# Patient Record
Sex: Female | Born: 1965 | Hispanic: Yes
Health system: Southern US, Community
[De-identification: ages and names within clinical notes are randomized; demographics above are authoritative.]

## PROBLEM LIST (undated history)

## (undated) DIAGNOSIS — I10 Essential (primary) hypertension: Secondary | ICD-10-CM

## (undated) DIAGNOSIS — R079 Chest pain, unspecified: Secondary | ICD-10-CM

---

## 2014-05-27 ENCOUNTER — Encounter (HOSPITAL_COMMUNITY): Payer: Self-pay | Admitting: Emergency Medicine

## 2014-05-27 ENCOUNTER — Emergency Department (HOSPITAL_COMMUNITY)
Admission: EM | Admit: 2014-05-27 | Discharge: 2014-05-27 | Disposition: A | Discharge status: Home / Self Care | Payer: Self-pay | Attending: Emergency Medicine | Admitting: Emergency Medicine

## 2014-05-27 ENCOUNTER — Emergency Department (HOSPITAL_COMMUNITY): Payer: Self-pay

## 2014-05-27 DIAGNOSIS — R0781 Pleurodynia: Secondary | ICD-10-CM

## 2014-05-27 DIAGNOSIS — R071 Chest pain on breathing: Secondary | ICD-10-CM | POA: Insufficient documentation

## 2014-05-27 DIAGNOSIS — I10 Essential (primary) hypertension: Secondary | ICD-10-CM | POA: Insufficient documentation

## 2014-05-27 LAB — COMPREHENSIVE METABOLIC PANEL
ALT: 137 U/L — ABNORMAL HIGH (ref 0–35)
AST: 95 U/L — ABNORMAL HIGH (ref 0–37)
Albumin: 4.4 g/dL (ref 3.5–5.2)
Alkaline Phosphatase: 81 U/L (ref 39–117)
Anion gap: 14 (ref 5–15)
BUN: 11 mg/dL (ref 6–23)
CALCIUM: 10 mg/dL (ref 8.4–10.5)
CO2: 24 mEq/L (ref 19–32)
CREATININE: 0.66 mg/dL (ref 0.50–1.10)
Chloride: 101 mEq/L (ref 96–112)
GFR calc non Af Amer: >90 mL/min (ref >90)
GLUCOSE: 115 mg/dL — AB (ref 70–99)
Potassium: 3.1 mEq/L — ABNORMAL LOW (ref 3.7–5.3)
Sodium: 139 mEq/L (ref 137–147)
Total Bilirubin: 0.7 mg/dL (ref 0.3–1.2)
Total Protein: 8.4 g/dL — ABNORMAL HIGH (ref 6.0–8.3)

## 2014-05-27 LAB — CBC WITH DIFFERENTIAL/PLATELET
BASOS ABS: 0 10*3/uL (ref 0.0–0.1)
Basophils Relative: 0 % (ref 0–1)
Eosinophils Absolute: 0.1 10*3/uL (ref 0.0–0.7)
Eosinophils Relative: 1 % (ref 0–5)
HCT: 33.1 % — ABNORMAL LOW (ref 36.0–46.0)
Hemoglobin: 10.4 g/dL — ABNORMAL LOW (ref 12.0–15.0)
LYMPHS ABS: 1.6 10*3/uL (ref 0.7–4.0)
LYMPHS PCT: 21 % (ref 12–46)
MCH: 22.3 pg — ABNORMAL LOW (ref 26.0–34.0)
MCHC: 31.4 g/dL (ref 30.0–36.0)
MCV: 70.9 fL — ABNORMAL LOW (ref 78.0–100.0)
Monocytes Absolute: 0.8 10*3/uL (ref 0.1–1.0)
Monocytes Relative: 10 % (ref 3–12)
Neutro Abs: 5.4 10*3/uL (ref 1.7–7.7)
Neutrophils Relative %: 68 % (ref 43–77)
Platelets: 220 10*3/uL (ref 150–400)
RBC: 4.67 MIL/uL (ref 3.87–5.11)
RDW: 18.1 % — ABNORMAL HIGH (ref 11.5–15.5)
WBC: 7.9 10*3/uL (ref 4.0–10.5)

## 2014-05-27 LAB — TROPONIN I

## 2014-05-27 MED ORDER — ASPIRIN 81 MG PO CHEW
324.0000 mg | CHEWABLE_TABLET | Freq: Once | ORAL | Status: DC
  Filled 2014-05-27: qty 4

## 2014-05-27 MED ORDER — POTASSIUM CHLORIDE CRYS ER 20 MEQ PO TBCR
40.0000 meq | EXTENDED_RELEASE_TABLET | Freq: Once | ORAL | Status: AC
  Administered 2014-05-27: 40 meq via ORAL
  Filled 2014-05-27: qty 2

## 2014-05-27 MED ORDER — IBUPROFEN 800 MG PO TABS
800.0000 mg | ORAL_TABLET | Freq: Once | ORAL | Status: AC
  Administered 2014-05-27: 800 mg via ORAL
  Filled 2014-05-27: qty 1

## 2014-05-27 NOTE — ED Notes (Addendum)
Called lab to see where lab tech was to draw pts blood. Asher MuirJamie stated that Dedra SkeensGwen was on her way over to the ED this is about 1 hour after labs were ordered.

## 2014-05-27 NOTE — Discharge Instructions (Signed)
Take two naproxen (Aleve) tablets at a time, twice a day.  Pleuresa (Pleurisy) La pleuresa es la inflamacin y Water quality scientistedematizacin de los tejidos que recubren los pulmones (pleura). Debido a esta inflamacin, se siente dolor al respirar. Este puede agravarse al toser, rer o al respirar profundo. La causa de la pleuresa generalmente es una enfermedad o infeccin subyacente.  INSTRUCCIONES PARA EL CUIDADO EN EL HOGAR  Controle su afeccin para ver si hay cambios. Las siguientes acciones ayudarn a Architectural technologistaliviar cualquier molestia que pueda presentar:  Los medicamentos pueden Engineer, materialsaliviar el dolor. Slo tome medicamentos de venta libre o recetados para Primary school teachercalmar el dolor, Environmental health practitionerel malestar o bajar la fiebre, segn las indicaciones de su mdico.  CenterPoint Energyome los antibiticos tal como se le indic. Finalice la prescripcin completa, aunque se sienta mejor. SOLICITE ATENCIN MDICA SI:   No consigue Engineer, materialsaliviar el dolor con la medicacin, o el dolor [place].  Observa un aumento de las secreciones similares a pus (purulentas) cuando tose. SOLICITE ATENCIN MDICA DE INMEDIATO SI:   Observa una coloracin Sears Holdings Corporationazul en los labios, los dedos de las manos o de los pies.  Elimina sangre al toser.  Tiene una creciente dificultad para respirar.  Sufre un dolor continuo que no se alivia con los medicamentos o dura ms de C.H. Robinson Worldwideuna semana.  Siente un dolor que se irradia hacia el cuello, los brazos o la [place] University[place]bula.  Presenta problemas respiratorios en aumento o le falta el aire.  Comienza a sentir fiebre, tiene urticaria, vomita, se desmaya u otras molestias de importancia. ASEGRESE DE QUE:  Comprende estas instrucciones.   Controlar su afeccin.   Recibir ayuda de inmediato si no mejora o si empeora.  Document Released: 05/07/2005 Document Revised: 03/30/2013 [facility] Patient Information 2015 [place], [place]. This information is not intended to replace advice given to you by your health care provider. Make sure you discuss any  questions you have with your health care provider.

## 2014-05-27 NOTE — ED Provider Notes (Signed)
CSN: [id number]     Arrival date & time 05/27/14  0058 History   First MD Initiated Contact with Patient 05/27/14 0106     Chief Complaint  Patient presents with  . Chest Pain     (Consider location/radiation/quality/duration/timing/severity/associated sxs/prior Treatment) Patient is a 48 y.o. female presenting with chest pain. The history is provided by the patient.  Chest Pain She complains of sharp anterior chest pain which started yesterday. Pain comes and goes but tends to be worse with deep breathing. It is not worse with movement. There is no associated dyspnea, nausea, diaphoresis. She denies fever or cough. She has not taken anything for pain. She denies any recent trauma or overuse. She is nonsmoker and denies history of hypertension or diabetes or hyperlipidemia.  History reviewed. No pertinent past medical history. History reviewed. No pertinent past surgical history. No family history on file. History  Substance Use Topics  . Smoking status: Never Smoker   . Smokeless tobacco: Not on file  . Alcohol Use: No   OB History   Grav Para Term Preterm Abortions TAB SAB Ect Mult Living                 Review of Systems  Cardiovascular: Positive for chest pain.  All other systems reviewed and are negative.     Allergies  Review of patient's allergies indicates no known allergies.  Home Medications   Prior to Admission medications   Not on File   BP 172/89  Pulse 63  Temp(Src) 98.6 F (37 C) (Oral)  Resp 22  Ht 5' (1.524 m)  Wt 150 lb (68.04 kg)  BMI 29.30 kg/m2  SpO2 99%  LMP 05/04/2014 Physical Exam  Nursing note and vitals reviewed.  48 year old female, resting comfortably and in no acute distress. Vital signs are significant for hypertension and tachypnea. Oxygen saturation is 99%, which is normal. Head is normocephalic and atraumatic. PERRLA, EOMI. Oropharynx is clear. Neck is nontender and supple without adenopathy or JVD. Back is nontender and  there is no CVA tenderness. Lungs are clear without rales, wheezes, or rhonchi. Chest has mild bilateral parasternal tenderness which reproduces her pain. Heart has regular rate and rhythm without murmur. Abdomen is soft, flat, nontender without masses or hepatosplenomegaly and peristalsis is normoactive. Extremities have no cyanosis or edema, full range of motion is present. Skin is warm and dry without rash. Neurologic: Mental status is normal, cranial nerves are intact, there are no motor or sensory deficits.  ED Course  Procedures (including critical care time) Labs Review Results for orders placed during the hospital encounter of 05/27/14  COMPREHENSIVE METABOLIC PANEL      Result Value Ref Range   Sodium 139  137 - 147 mEq/L   Potassium 3.1 (*) 3.7 - 5.3 mEq/L   Chloride 101  96 - 112 mEq/L   CO2 24  19 - 32 mEq/L   Glucose, Bld 115 (*) 70 - 99 mg/dL   BUN 11  6 - 23 mg/dL   Creatinine, Ser 4.090.66  0.50 - 1.10 mg/dL   Calcium 81.110.0  8.4 - 91.410.5 mg/dL   Total Protein 8.4 (*) 6.0 - 8.3 g/dL   Albumin 4.4  3.5 - 5.2 g/dL   AST 95 (*) 0 - 37 U/L   ALT 137 (*) 0 - 35 U/L   Alkaline Phosphatase 81  39 - 117 U/L   Total Bilirubin 0.7  0.3 - 1.2 mg/dL   GFR calc non Af  Amer >90  >90 mL/min   GFR calc Af Amer >90  >90 mL/min   Anion gap 14  5 - 15  TROPONIN I      Result Value Ref Range   Troponin I <0.30  <0.30 ng/mL  CBC WITH DIFFERENTIAL      Result Value Ref Range   WBC 7.9  4.0 - 10.5 K/uL   RBC 4.67  3.87 - 5.11 MIL/uL   Hemoglobin 10.4 (*) 12.0 - 15.0 g/dL   HCT 16.133.1 (*) 09.636.0 - 04.546.0 %   MCV 70.9 (*) 78.0 - 100.0 fL   MCH 22.3 (*) 26.0 - 34.0 pg   MCHC 31.4  30.0 - 36.0 g/dL   RDW 40.918.1 (*) 81.111.5 - 91.415.5 %   Platelets 220  150 - 400 K/uL   Neutrophils Relative % 68  43 - 77 %   Neutro Abs 5.4  1.7 - 7.7 K/uL   Lymphocytes Relative 21  12 - 46 %   Lymphs Abs 1.6  0.7 - 4.0 K/uL   Monocytes Relative 10  3 - 12 %   Monocytes Absolute 0.8  0.1 - 1.0 K/uL   Eosinophils  Relative 1  0 - 5 %   Eosinophils Absolute 0.1  0.0 - 0.7 K/uL   Basophils Relative 0  0 - 1 %   Basophils Absolute 0.0  0.0 - 0.1 K/uL   Imaging Review Dg Chest Portable 1 View  05/27/2014   CLINICAL DATA:  48 year old female with chest pain for 2 days. Initial encounter.  EXAM: PORTABLE CHEST - 1 VIEW  COMPARISON:  None.  FINDINGS: Heart size top-normal.  No infiltrate, congestive heart failure or pneumothorax.  The patient would eventually benefit from two view chest.  IMPRESSION: No acute abnormality.  Please see above.   Electronically Signed   By: Bridgett LarssonSteve  Olson M.D.   On: 05/27/2014 01:56     EKG Interpretation   Date/Time:  Saturday May 27 2014 01:08:05 EDT Ventricular Rate:  61 PR Interval:  163 QRS Duration: 104 QT Interval:  447 QTC Calculation: 450 R Axis:   13 Text Interpretation:  Sinus rhythm RSR' in V1 or V2, right VCD or RVH  Otherwise within normal limits No old tracing to compare Confirmed by  [facility]  MD, Subrena Devereux ([id number]) on 05/27/2014 1:12:59 AM      MDM   Final diagnoses:  Pleuritic chest pain    Chest pain which appears to be chest wall pain. Hypertension. Screening labs and chest x-ray have been ordered and she'll be given a dose of ibuprofen. [facility].  She feels much after above noted treatment. Workup was unremarkable. She is discharged with instructions to take over-the-counter naproxen.  Dione Boozeavid Janelli Welling, MD 05/27/14 [phone]

## 2014-05-27 NOTE — ED Notes (Signed)
Patient presents to ER with c/o intermittent mid-sternal chest pain that is sharp in nature x 2 days.  Patient states pain became worse today.

## 2015-05-28 ENCOUNTER — Encounter (HOSPITAL_COMMUNITY): Payer: Self-pay | Admitting: *Deleted

## 2015-05-28 ENCOUNTER — Emergency Department (HOSPITAL_COMMUNITY)
Admission: EM | Admit: 2015-05-28 | Discharge: 2015-05-28 | Disposition: A | Discharge status: Home / Self Care | Payer: Self-pay | Attending: Emergency Medicine | Admitting: Emergency Medicine

## 2015-05-28 ENCOUNTER — Emergency Department (HOSPITAL_COMMUNITY): Payer: Self-pay

## 2015-05-28 DIAGNOSIS — R079 Chest pain, unspecified: Secondary | ICD-10-CM | POA: Insufficient documentation

## 2015-05-28 DIAGNOSIS — I10 Essential (primary) hypertension: Secondary | ICD-10-CM | POA: Insufficient documentation

## 2015-05-28 DIAGNOSIS — Z79899 Other long term (current) drug therapy: Secondary | ICD-10-CM | POA: Insufficient documentation

## 2015-05-28 HISTORY — DX: Essential (primary) hypertension: I10

## 2015-05-28 HISTORY — DX: Chest pain, unspecified: R07.9

## 2015-05-28 LAB — CBC
HEMATOCRIT: 39.5 % (ref 36.0–46.0)
HEMOGLOBIN: 13.1 g/dL (ref 12.0–15.0)
MCH: 27.3 pg (ref 26.0–34.0)
MCHC: 33.2 g/dL (ref 30.0–36.0)
MCV: 82.3 fL (ref 78.0–100.0)
Platelets: 202 10*3/uL (ref 150–400)
RBC: 4.8 MIL/uL (ref 3.87–5.11)
RDW: 15.3 % (ref 11.5–15.5)
WBC: 6.7 10*3/uL (ref 4.0–10.5)

## 2015-05-28 LAB — BASIC METABOLIC PANEL
Anion gap: 5 (ref 5–15)
BUN: 14 mg/dL (ref 6–20)
CALCIUM: 10 mg/dL (ref 8.9–10.3)
CO2: 26 mmol/L (ref 22–32)
Chloride: 108 mmol/L (ref 101–111)
Creatinine, Ser: 0.63 mg/dL (ref 0.44–1.00)
Glucose, Bld: 103 mg/dL — ABNORMAL HIGH (ref 65–99)
POTASSIUM: 3.4 mmol/L — AB (ref 3.5–5.1)
Sodium: 139 mmol/L (ref 135–145)

## 2015-05-28 LAB — HEPATIC FUNCTION PANEL
ALBUMIN: 4 g/dL (ref 3.5–5.0)
ALT: 22 U/L (ref 14–54)
AST: 23 U/L (ref 15–41)
Alkaline Phosphatase: 56 U/L (ref 38–126)
Bilirubin, Direct: 0.1 mg/dL (ref 0.1–0.5)
Indirect Bilirubin: 0.8 mg/dL (ref 0.3–0.9)
Total Bilirubin: 0.9 mg/dL (ref 0.3–1.2)
Total Protein: 7.3 g/dL (ref 6.5–8.1)

## 2015-05-28 LAB — TROPONIN I

## 2015-05-28 LAB — LIPASE, BLOOD: LIPASE: 20 U/L — AB (ref 22–51)

## 2015-05-28 MED ORDER — TRAMADOL HCL 50 MG PO TABS: 50.0000 mg | ORAL_TABLET | Freq: Four times a day (QID) | ORAL | Status: AC | PRN

## 2015-05-28 MED ORDER — IOHEXOL 350 MG/ML SOLN
100.0000 mL | Freq: Once | INTRAVENOUS | Status: AC | PRN
  Administered 2015-05-28: 100 mL via INTRAVENOUS

## 2015-05-28 MED ORDER — LISINOPRIL 20 MG PO TABS: 20.0000 mg | ORAL_TABLET | Freq: Every day | ORAL | Status: AC

## 2015-05-28 NOTE — ED Notes (Signed)
Dr Zammit at bedside,  

## 2015-05-28 NOTE — ED Provider Notes (Signed)
CSN: [id number]     Arrival date & time 05/28/15  1731 History   First MD Initiated Contact with Patient 05/28/15 1731     Chief Complaint  Patient presents with  . Chest Pain     (Consider location/radiation/quality/duration/timing/severity/associated sxs/prior Treatment) Patient is a 49 y.o. female presenting with chest pain. The history is provided by the patient (The patient complains of some chest pain off and on. Related to take in deep breaths or exertion patient has a history of hypertension that she stopped taking her medicine).  Chest Pain Pain location:  Substernal area Pain quality: aching   Pain radiates to:  Does not radiate Pain radiates to the back: yes   Pain severity:  Moderate Timing:  Intermittent Progression:  Waxing and waning Chronicity:  New Context: not breathing   Relieved by:  Nothing Associated symptoms: no abdominal pain, no back pain, no cough, no fatigue and no headache     Past Medical History  Diagnosis Date  . Hypertension   . Chest pain    History reviewed. No pertinent past surgical history. History reviewed. No pertinent family history. Social History  Substance Use Topics  . Smoking status: Never Smoker   . Smokeless tobacco: None  . Alcohol Use: No   OB History    No data available     Review of Systems  Constitutional: Negative for appetite change and fatigue.  HENT: Negative for congestion, ear discharge and sinus pressure.   Eyes: Negative for discharge.  Respiratory: Negative for cough.   Cardiovascular: Positive for chest pain.  Gastrointestinal: Negative for abdominal pain and diarrhea.  Genitourinary: Negative for frequency and hematuria.  Musculoskeletal: Negative for back pain.  Skin: Negative for rash.  Neurological: Negative for seizures and headaches.  Psychiatric/Behavioral: Negative for hallucinations.      Allergies  Tylenol  Home Medications   Prior to Admission medications   Medication Sig Start  Date End Date Taking? Authorizing Provider  lisinopril (PRINIVIL,ZESTRIL) 20 MG tablet Take 1 tablet (20 mg total) by mouth daily. 05/28/15   Bethann BerkshireJoseph Camree Wigington, MD  traMADol (ULTRAM) 50 MG tablet Take 1 tablet (50 mg total) by mouth every 6 (six) hours as needed. 05/28/15   Bethann BerkshireJoseph Demtrius Rounds, MD   BP 176/82 mmHg  Pulse 56  Temp(Src) 98 F (36.7 C) (Oral)  Resp 12  Ht 4\' 10"  (1.473 m)  Wt 140 lb (63.504 kg)  BMI 29.27 kg/m2  SpO2 99% Physical Exam  Constitutional: She is oriented to person, place, and time. She appears well-developed.  HENT:  Head: Normocephalic.  Eyes: Conjunctivae and EOM are normal. No scleral icterus.  Neck: Neck supple. No thyromegaly present.  Cardiovascular: Normal rate and regular rhythm.  Exam reveals no gallop and no friction rub.   No murmur heard. Pulmonary/Chest: No stridor. She has no wheezes. She has no rales. She exhibits no tenderness.  Abdominal: She exhibits no distension. There is no tenderness. There is no rebound.  Musculoskeletal: Normal range of motion. She exhibits no edema.  Lymphadenopathy:    She has no cervical adenopathy.  Neurological: She is oriented to person, place, and time. She exhibits normal muscle tone. Coordination normal.  Skin: No rash noted. No erythema.  Psychiatric: She has a normal mood and affect. Her behavior is normal.    ED Course  Procedures (including critical care time) Labs Review Labs Reviewed  BASIC METABOLIC PANEL - Abnormal; Notable for the following:    Potassium 3.4 (*)    Glucose,  Bld 103 (*)    All other components within normal limits  LIPASE, BLOOD - Abnormal; Notable for the following:    Lipase 20 (*)    All other components within normal limits  CBC  TROPONIN I  HEPATIC FUNCTION PANEL    Imaging Review Dg Chest 2 View  05/28/2015  CLINICAL DATA:  Sharp central chest pain for 3-4 days. EXAM: CHEST  2 VIEW COMPARISON:  05/27/2014 FINDINGS: The cardiomediastinal contours are normal. Mild central  bronchitic change. Pulmonary vasculature is normal. No consolidation, pleural effusion, or pneumothorax. No acute osseous abnormalities are seen. IMPRESSION: Mild central bronchitic change. Electronically Signed   By: Rubye Oaks M.D.   On: 05/28/2015 18:37   Ct Angio Chest Pe W/cm &/or Wo Cm  05/28/2015  CLINICAL DATA:  Initial evaluation for midsternal chest pain for 2 days. EXAM: CT ANGIOGRAPHY CHEST WITH CONTRAST TECHNIQUE: Multidetector CT imaging of the chest was performed using the standard protocol during bolus administration of intravenous contrast. Multiplanar CT image reconstructions and MIPs were obtained to evaluate the vascular anatomy. CONTRAST:  OMNIPAQUE IOHEXOL 350 MG/ML SOLN COMPARISON:  Prior radiograph from earlier the same day. FINDINGS: Visualized thyroid gland is normal in appearance. No pathologically enlarged mediastinal, hilar, or axillary lymph nodes identified. Rightward bowing of the tracheal air column noted. Intrathoracic aorta of normal caliber and appearance. No significant atheromatous disease within the aorta itself. No evidence for aneurysm or dissection. Great vessels within normal limits. Heart size normal.  No pericardial effusion. Main pulmonary artery normal in size measuring 2.1 cm in transverse diameter. Pulmonary arterial tree is adequately opacified for evaluation. No filling defect to suggest acute pulmonary embolism. Re-formatted imaging confirms these findings. Lungs are clear without focal infiltrate or pulmonary edema. No pneumothorax. No pleural effusion. No worrisome pulmonary nodule or mass. Visualized portions of the upper abdomen demonstrate no acute abnormality. No acute osseous abnormality. No worrisome lytic or blastic osseous lesions. IMPRESSION: 1. No CT evidence for acute pulmonary embolism. 2. No other acute cardiopulmonary abnormality identified. Electronically Signed   By: Rise Mu M.D.   On: 05/28/2015 20:31   I have  personally reviewed and evaluated these images and lab results as part of my medical decision-making.   EKG Interpretation   Date/Time:  Monday May 28 2015 17:31:51 EDT Ventricular Rate:  64 PR Interval:  155 QRS Duration: 105 QT Interval:  421 QTC Calculation: 434 R Axis:   6 Text Interpretation:  Sinus rhythm RSR' in V1 or V2, right VCD or RVH  Confirmed by Jenilee Franey  MD, Anaysha Andre [phone]) on 05/28/2015 9:13:34 PM      MDM   Final diagnoses:  Chest pain at rest  Essential hypertension    Labs unremarkable chest x-ray and CT of chest unremarkable. Doubt coronary artery disease. Will start patient on lisinopril and Ultram for pain. She is to follow-up with cardiology next couple weeks    Bethann Berkshire, MD 05/28/15 2114

## 2015-05-28 NOTE — ED Notes (Signed)
Patient reports chest pain x 3-4 days with radiation to back between shoulder blades, reports pain worsens with deep breaths and palpation. Had syncopal episode lasting 2 minutes per family. Also reports headache and dizziness today as well. Alert and oriented at this time.

## 2015-05-28 NOTE — ED Notes (Signed)
Lab at bedside

## 2015-05-28 NOTE — ED Notes (Signed)
Received report on pt, pt resting in bed, denies any complaints, pt and family updated on plan of care,

## 2015-05-28 NOTE — Discharge Instructions (Signed)
Follow up with cardiology in [place] in 1-2 weeks

## 2016-12-24 IMAGING — CT CT ANGIO CHEST
1 of 6 series · 5 of 36 positions shown · IV contrast (Omnipaque 300)
Comparison: Prior radiograph from earlier the same day.

CLINICAL DATA: Initial evaluation for midsternal chest pain for 2
days.

EXAM:
CT ANGIOGRAPHY CHEST WITH CONTRAST
TECHNIQUE: Multidetector CT imaging of the chest was performed using the
standard protocol during bolus administration of intravenous
contrast. Multiplanar CT image reconstructions and MIPs were
obtained to evaluate the vascular anatomy.
CONTRAST:  100mL OMNIPAQUE IOHEXOL 350 MG/ML SOLN

[Series 6: pe 3.0 b40f · axial · 0.53mm/px · z∈[-150,+6]mm · 5 of 79 slices shown]
[im 14/79  lung]
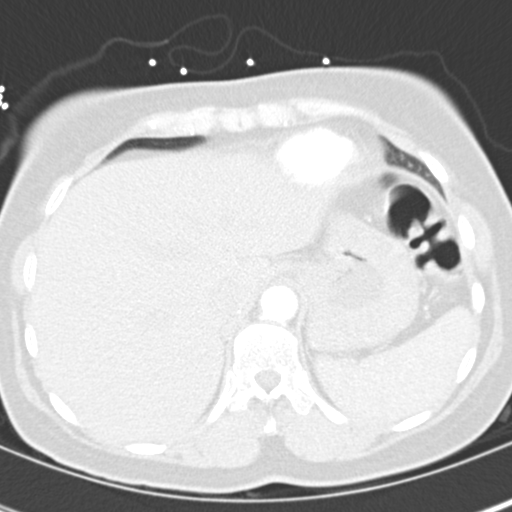
[im 27/79  mediastinal]
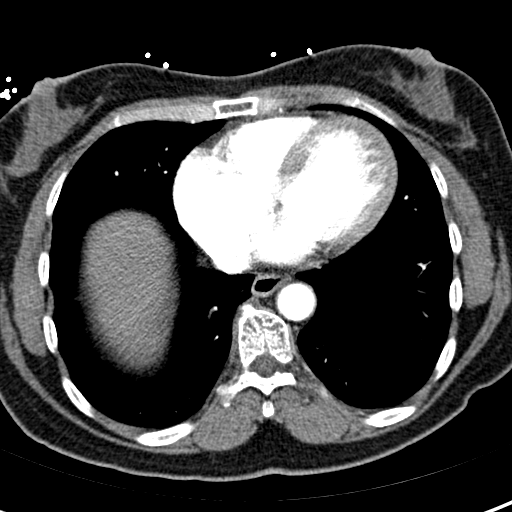
[im 40/79  lung]
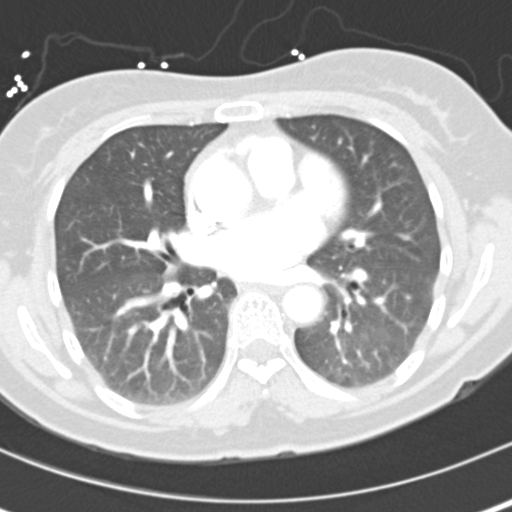
[im 53/79  mediastinal]
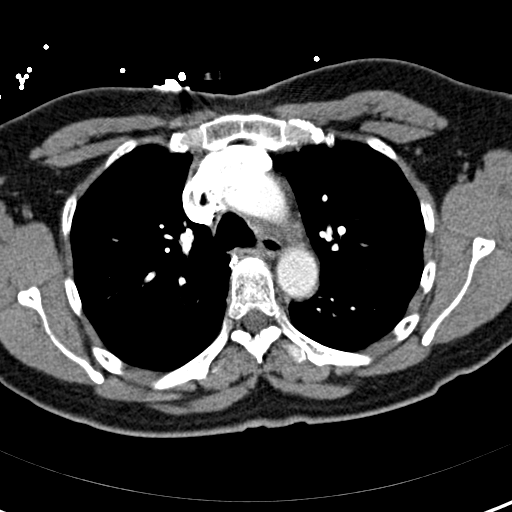
[im 66/79  lung]
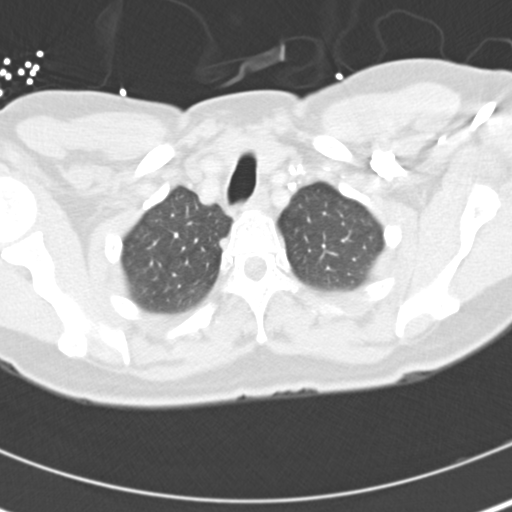

[5 of 36 positions shown; findings below may reference images not displayed]

FINDINGS: Visualized thyroid gland is normal in appearance. No pathologically
enlarged mediastinal, hilar, or axillary lymph nodes identified.
Rightward bowing of the tracheal air column noted.

Intrathoracic aorta of normal caliber and appearance. No significant
atheromatous disease within the aorta itself. No evidence for
aneurysm or dissection. Great vessels within normal limits.

Heart size normal.  No pericardial effusion.

Main pulmonary artery normal in size measuring 2.1 cm in transverse
diameter. Pulmonary arterial tree is adequately opacified for
evaluation. No filling defect to suggest acute pulmonary embolism.
Re-formatted imaging confirms these findings.

Lungs are clear without focal infiltrate or pulmonary edema. No
pneumothorax. No pleural effusion. No worrisome pulmonary nodule or
mass.

Visualized portions of the upper abdomen demonstrate no acute
abnormality.

No acute osseous abnormality. No worrisome lytic or blastic osseous
lesions.
IMPRESSION: 1. No CT evidence for acute pulmonary embolism.
2. No other acute cardiopulmonary abnormality identified.

## 2019-09-15 ENCOUNTER — Other Ambulatory Visit: Payer: Self-pay

## 2019-09-15 ENCOUNTER — Ambulatory Visit: Payer: Self-pay | Attending: Internal Medicine

## 2019-09-15 DIAGNOSIS — U071 COVID-19: Secondary | ICD-10-CM | POA: Insufficient documentation

## 2019-09-15 DIAGNOSIS — Z20822 Contact with and (suspected) exposure to covid-19: Secondary | ICD-10-CM

## 2019-09-16 LAB — NOVEL CORONAVIRUS, NAA: SARS-CoV-2, NAA: DETECTED — AB

## 2020-02-15 ENCOUNTER — Emergency Department (HOSPITAL_COMMUNITY)
Admission: EM | Admit: 2020-02-15 | Discharge: 2020-02-15 | Disposition: A | Discharge status: Home / Self Care | Payer: Self-pay | Attending: Emergency Medicine | Admitting: Emergency Medicine

## 2020-02-15 ENCOUNTER — Other Ambulatory Visit: Payer: Self-pay

## 2020-02-15 ENCOUNTER — Encounter (HOSPITAL_COMMUNITY): Payer: Self-pay | Admitting: *Deleted

## 2020-02-15 DIAGNOSIS — Z5321 Procedure and treatment not carried out due to patient leaving prior to being seen by health care provider: Secondary | ICD-10-CM | POA: Insufficient documentation

## 2020-02-15 DIAGNOSIS — H5789 Other specified disorders of eye and adnexa: Secondary | ICD-10-CM | POA: Insufficient documentation

## 2020-02-15 NOTE — ED Notes (Signed)
Patient not in room at present. 

## 2020-02-15 NOTE — ED Triage Notes (Signed)
Pt with swelling to left eye since Sunday, swelling worse Monday and Tuesday.  denies any drainage.  HA yesterday, denies at present.  Pt does not speak [place], family member with her taht does speak [place].
